# Patient Record
Sex: Female | Born: 1990 | Race: Black or African American | Hispanic: No | Marital: Single | Smoking: Current every day smoker
Health system: Southern US, Community
[De-identification: ages and names within clinical notes are randomized; demographics above are authoritative.]

## PROBLEM LIST (undated history)

## (undated) DIAGNOSIS — I10 Essential (primary) hypertension: Secondary | ICD-10-CM

## (undated) HISTORY — PX: LEG SURGERY: SHX1003

---

## 2016-08-09 ENCOUNTER — Emergency Department
Admission: EM | Admit: 2016-08-09 | Discharge: 2016-08-10 | Disposition: A | Discharge status: Home / Self Care | Payer: BLUE CROSS/BLUE SHIELD | Attending: Emergency Medicine | Admitting: Emergency Medicine

## 2016-08-09 ENCOUNTER — Emergency Department: Payer: BLUE CROSS/BLUE SHIELD

## 2016-08-09 DIAGNOSIS — I10 Essential (primary) hypertension: Secondary | ICD-10-CM | POA: Diagnosis not present

## 2016-08-09 DIAGNOSIS — F17200 Nicotine dependence, unspecified, uncomplicated: Secondary | ICD-10-CM | POA: Diagnosis not present

## 2016-08-09 DIAGNOSIS — R51 Headache: Secondary | ICD-10-CM | POA: Insufficient documentation

## 2016-08-09 DIAGNOSIS — R079 Chest pain, unspecified: Secondary | ICD-10-CM | POA: Diagnosis present

## 2016-08-09 HISTORY — DX: Essential (primary) hypertension: I10

## 2016-08-09 NOTE — ED Triage Notes (Signed)
Patient reports chest tightness.  Reports took bp at home it was elevated, patient with history of hypertension and states took extra dose of medication.  Reports now getting a headache.

## 2016-08-10 ENCOUNTER — Encounter: Payer: Self-pay | Admitting: Emergency Medicine

## 2016-08-10 LAB — BASIC METABOLIC PANEL
ANION GAP: 8 (ref 5–15)
BUN: 9 mg/dL (ref 6–20)
CHLORIDE: 99 mmol/L — AB (ref 101–111)
CO2: 29 mmol/L (ref 22–32)
Calcium: 9.3 mg/dL (ref 8.9–10.3)
Creatinine, Ser: 0.82 mg/dL (ref 0.44–1.00)
Glucose, Bld: 105 mg/dL — ABNORMAL HIGH (ref 65–99)
POTASSIUM: 3.1 mmol/L — AB (ref 3.5–5.1)
SODIUM: 136 mmol/L (ref 135–145)

## 2016-08-10 LAB — CBC
HCT: 38.1 % (ref 35.0–47.0)
HEMOGLOBIN: 12.2 g/dL (ref 12.0–16.0)
MCH: 22.1 pg — ABNORMAL LOW (ref 26.0–34.0)
MCHC: 31.9 g/dL — ABNORMAL LOW (ref 32.0–36.0)
MCV: 69.2 fL — ABNORMAL LOW (ref 80.0–100.0)
Platelets: 347 10*3/uL (ref 150–440)
RBC: 5.5 MIL/uL — AB (ref 3.80–5.20)
RDW: 19 % — ABNORMAL HIGH (ref 11.5–14.5)
WBC: 10.6 10*3/uL (ref 3.6–11.0)

## 2016-08-10 LAB — TROPONIN I: Troponin I: <0.03 ng/mL (ref <0.03)

## 2016-08-10 NOTE — ED Notes (Signed)
Pt discharged to home.  Discharge instructions reviewed.  Verbalized understanding.  No questions or concerns at this time.  Teach back verified.  Pt in NAD.  No items left in ED.   

## 2016-08-10 NOTE — ED Notes (Signed)
Pt requesting water to frink, per Dr. Zenda AlpersWebster okay to provide pt with water.

## 2016-08-10 NOTE — ED Provider Notes (Signed)
[facility] Emergency Department Provider Note   ____________________________________________   First MD Initiated Contact with Patient 08/10/16 0021     (approximate)  I have reviewed the triage vital signs and the nursing notes.   HISTORY  Chief Complaint Chest Pain and Hypertension    HPI Loretta Burke is a 26 y.o. female who comes into the hospital today with some chest pain and headache. The patient reports her blood pressure was also elevated at home. When she checked it was 180/120. The patient takes labetalol and took one at 10 PM. She reports that her blood pressure didn't come down so she took another one at 80 and decided to come in. The chest pain started EMS well and was in the left side of her chest. She did not take anything for the pain but reports that her chest pain is now down to 1 out of 10 in intensity. She reports her headache is also gone. The patient has no nausea or vomiting but did have a little bit of dizziness. She had no sweats and no shortness of breath. She is here today for evaluation of her chest pain and high blood pressure.   Past Medical History:  Diagnosis Date  . Hypertension     There are no active problems to display for this patient.   Past Surgical History:  Procedure Laterality Date  . LEG SURGERY      Prior to Admission medications   Not on File    Allergies Oxycodone  No family history on file.  Social History Social History  Substance Use Topics  . Smoking status: Current Every Day Smoker  . Smokeless tobacco: Not on file  . Alcohol use Yes    Review of Systems Constitutional: No fever/chills Eyes: No visual changes. ENT: No sore throat. Cardiovascular:  chest pain. Respiratory: Denies shortness of breath. Gastrointestinal: No abdominal pain.  No nausea, no vomiting.  No diarrhea.  No constipation. Genitourinary: Negative for dysuria. Musculoskeletal: Negative for back pain. Skin:  Negative for rash. Neurological: headache  10-point ROS otherwise negative.  ____________________________________________   PHYSICAL EXAM:  VITAL SIGNS: ED Triage Vitals  Enc Vitals Group     BP 08/09/16 2320 (!) 151/93     Pulse Rate 08/09/16 2320 73     Resp 08/09/16 2320 18     Temp 08/09/16 2320 98.4 F (36.9 C)     Temp Source 08/09/16 2320 Oral     SpO2 08/09/16 2320 99 %     Weight 08/09/16 2318 268 lb (121.6 kg)     Height 08/09/16 2318 5\' 4"  (1.626 m)     Head Circumference --      Peak Flow --      Pain Score 08/09/16 2318 6     Pain Loc --      Pain Edu? --      Excl. in GC? --     Constitutional: Alert and oriented. Well appearing and in no acute distress. Eyes: Conjunctivae are normal. PERRL. EOMI. Head: Atraumatic. Nose: No congestion/rhinnorhea. Mouth/Throat: Mucous membranes are moist.  Oropharynx non-erythematous. Cardiovascular: Normal rate, regular rhythm. Grossly normal heart sounds.  Good peripheral circulation. Respiratory: Normal respiratory effort.  No retractions. Lungs CTAB. Gastrointestinal: Soft and nontender. No distention. Positive bowel sounds Musculoskeletal: No lower extremity tenderness nor edema.   Neurologic:  Normal speech and language.  Skin:  Skin is warm, dry and intact.  Psychiatric: Mood and affect are normal.  ____________________________________________  LABS (all labs ordered are listed, but only abnormal results are displayed)  Labs Reviewed  BASIC METABOLIC PANEL - Abnormal; Notable for the following:       Result Value   Potassium 3.1 (*)    Chloride 99 (*)    Glucose, Bld 105 (*)    All other components within normal limits  CBC - Abnormal; Notable for the following:    RBC 5.50 (*)    MCV 69.2 (*)    MCH 22.1 (*)    MCHC 31.9 (*)    RDW 19.0 (*)    All other components within normal limits  TROPONIN I  TROPONIN I   ____________________________________________  EKG  ED ECG REPORT I, Rebecka ApleyWebster,   Allison P, the attending physician, personally viewed and interpreted this ECG.   Date: 08/09/2016  EKG Time: 2322  Rate: 76  Rhythm: normal sinus rhythm  Axis: normal  Intervals:none  ST&T Change: none  ____________________________________________  RADIOLOGY  CXR ____________________________________________   PROCEDURES  Procedure(s) performed: None  Procedures  Critical Care performed: No  ____________________________________________   INITIAL IMPRESSION / ASSESSMENT AND PLAN / ED COURSE  Pertinent labs & imaging results that were available during my care of the patient were reviewed by me and considered in my medical decision making (see chart for details).  This is 26 year old female who comes into the hospital today with some chest pain, headache and some elevated blood pressure. The patient did take some medication prior to coming in and her blood pressure has improved. The patient's pain is also improved. The patient's initial blood work is unremarkable but as this pain started right before she came and I will repeat a 3 hour troponin to insure that she is not having any developing ischemia. The patient's pain is controlled at this time. She is resting comfortably and I'll reassess the patient once her repeat her troponin.  Clinical Course as of Aug 10 241  Mon Aug 10, 2016  0025 No active cardiopulmonary disease. DG Chest 2 View [AW]    Clinical Course User Index [AW] Rebecka ApleyAllison P Webster, MD   The patient's repeat troponin is negative. She'll be discharged home to follow-up with her primary care physician or the acute care clinic. The patient's chest pain and headache are improved.  ____________________________________________   FINAL CLINICAL IMPRESSION(S) / ED DIAGNOSES  Final diagnoses:  Chest pain, unspecified type  Hypertension, unspecified type      NEW MEDICATIONS STARTED DURING THIS VISIT:  New Prescriptions   No medications on file      Note:  This document was prepared using Dragon voice recognition software and may include unintentional dictation errors.    Rebecka ApleyAllison P Webster, MD 08/10/16 [phone]

## 2018-07-24 IMAGING — CR DG CHEST 2V
2 series · 2 of 2 positions shown · non-contrast
Comparison: None.

CLINICAL DATA: Chest tightness

EXAM:
CHEST  2 VIEW

[chest pa]
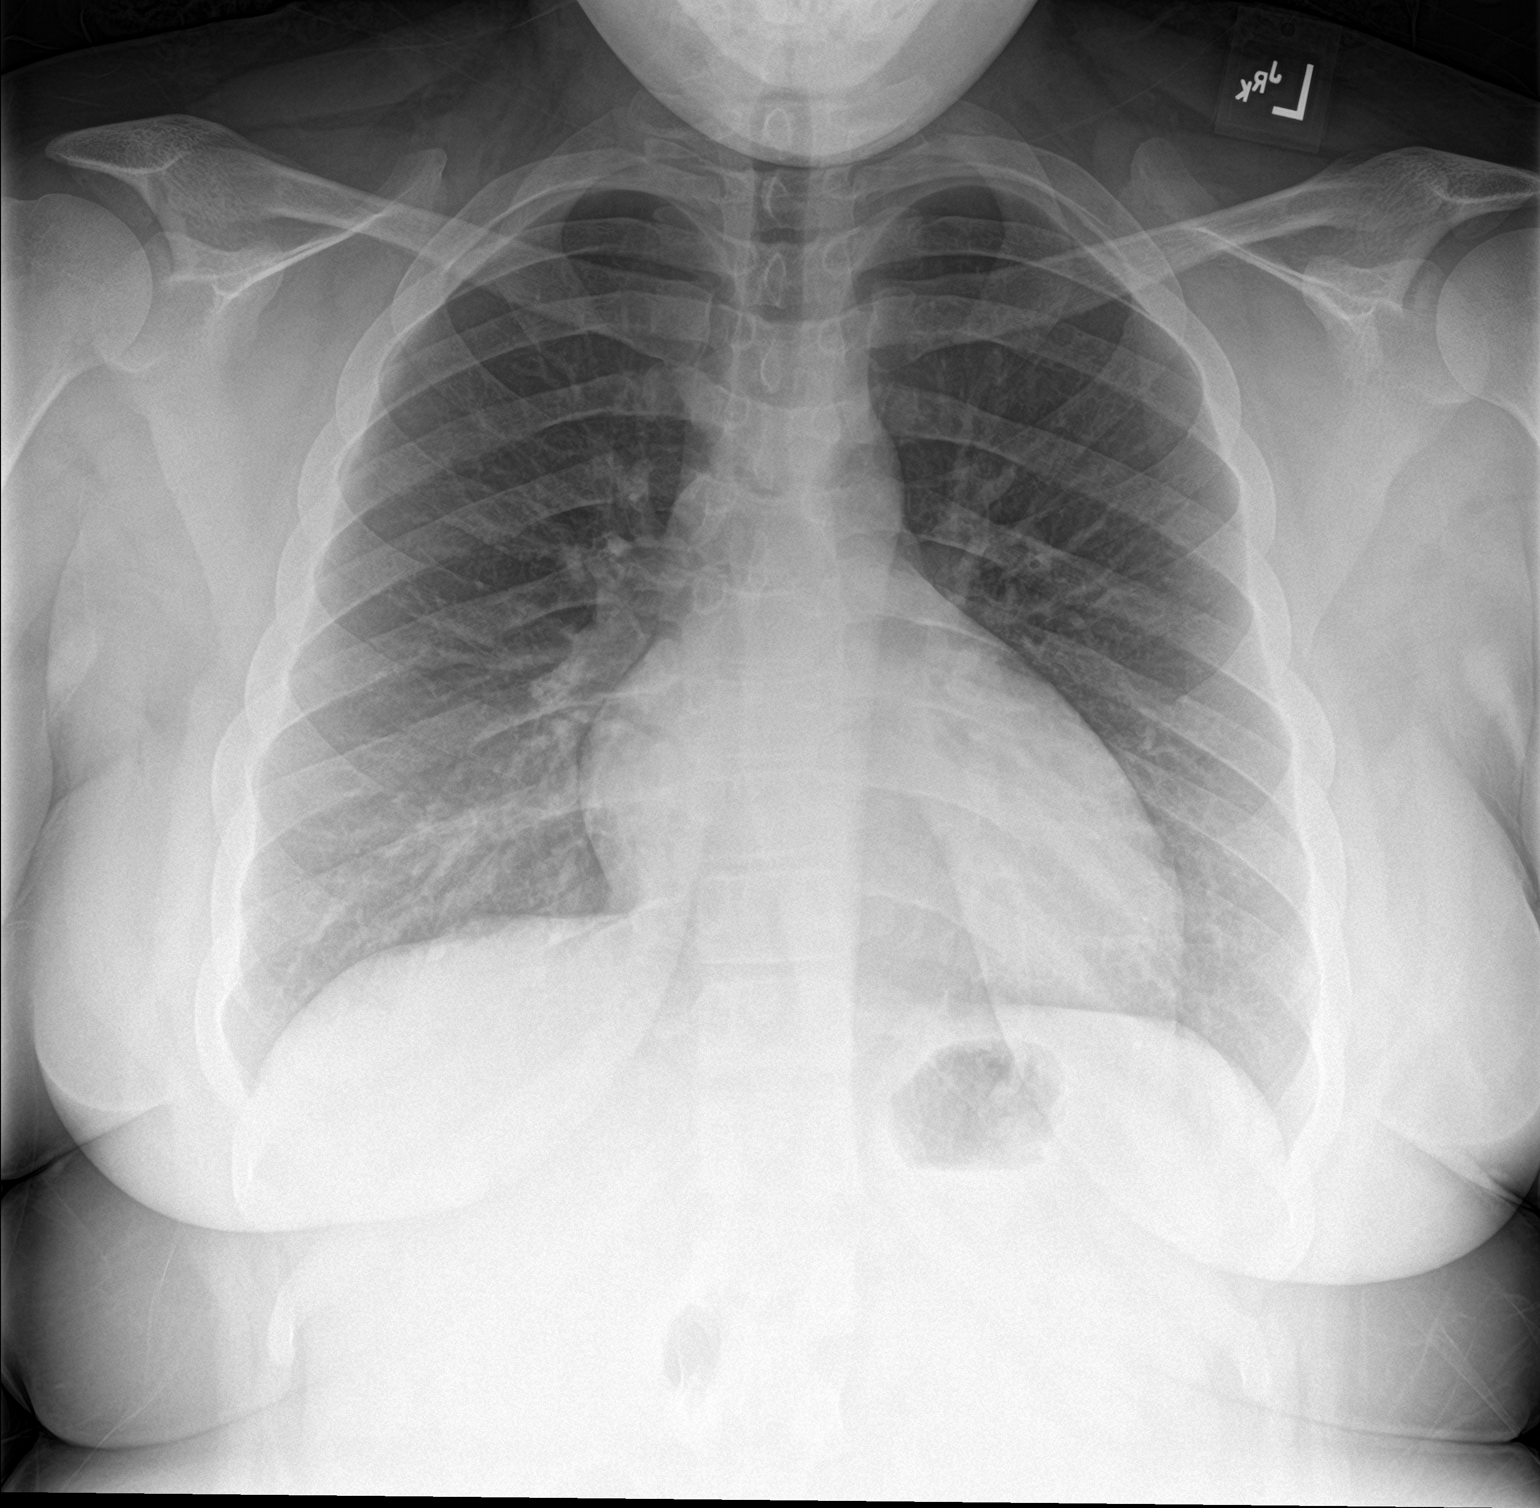

[chest lat]
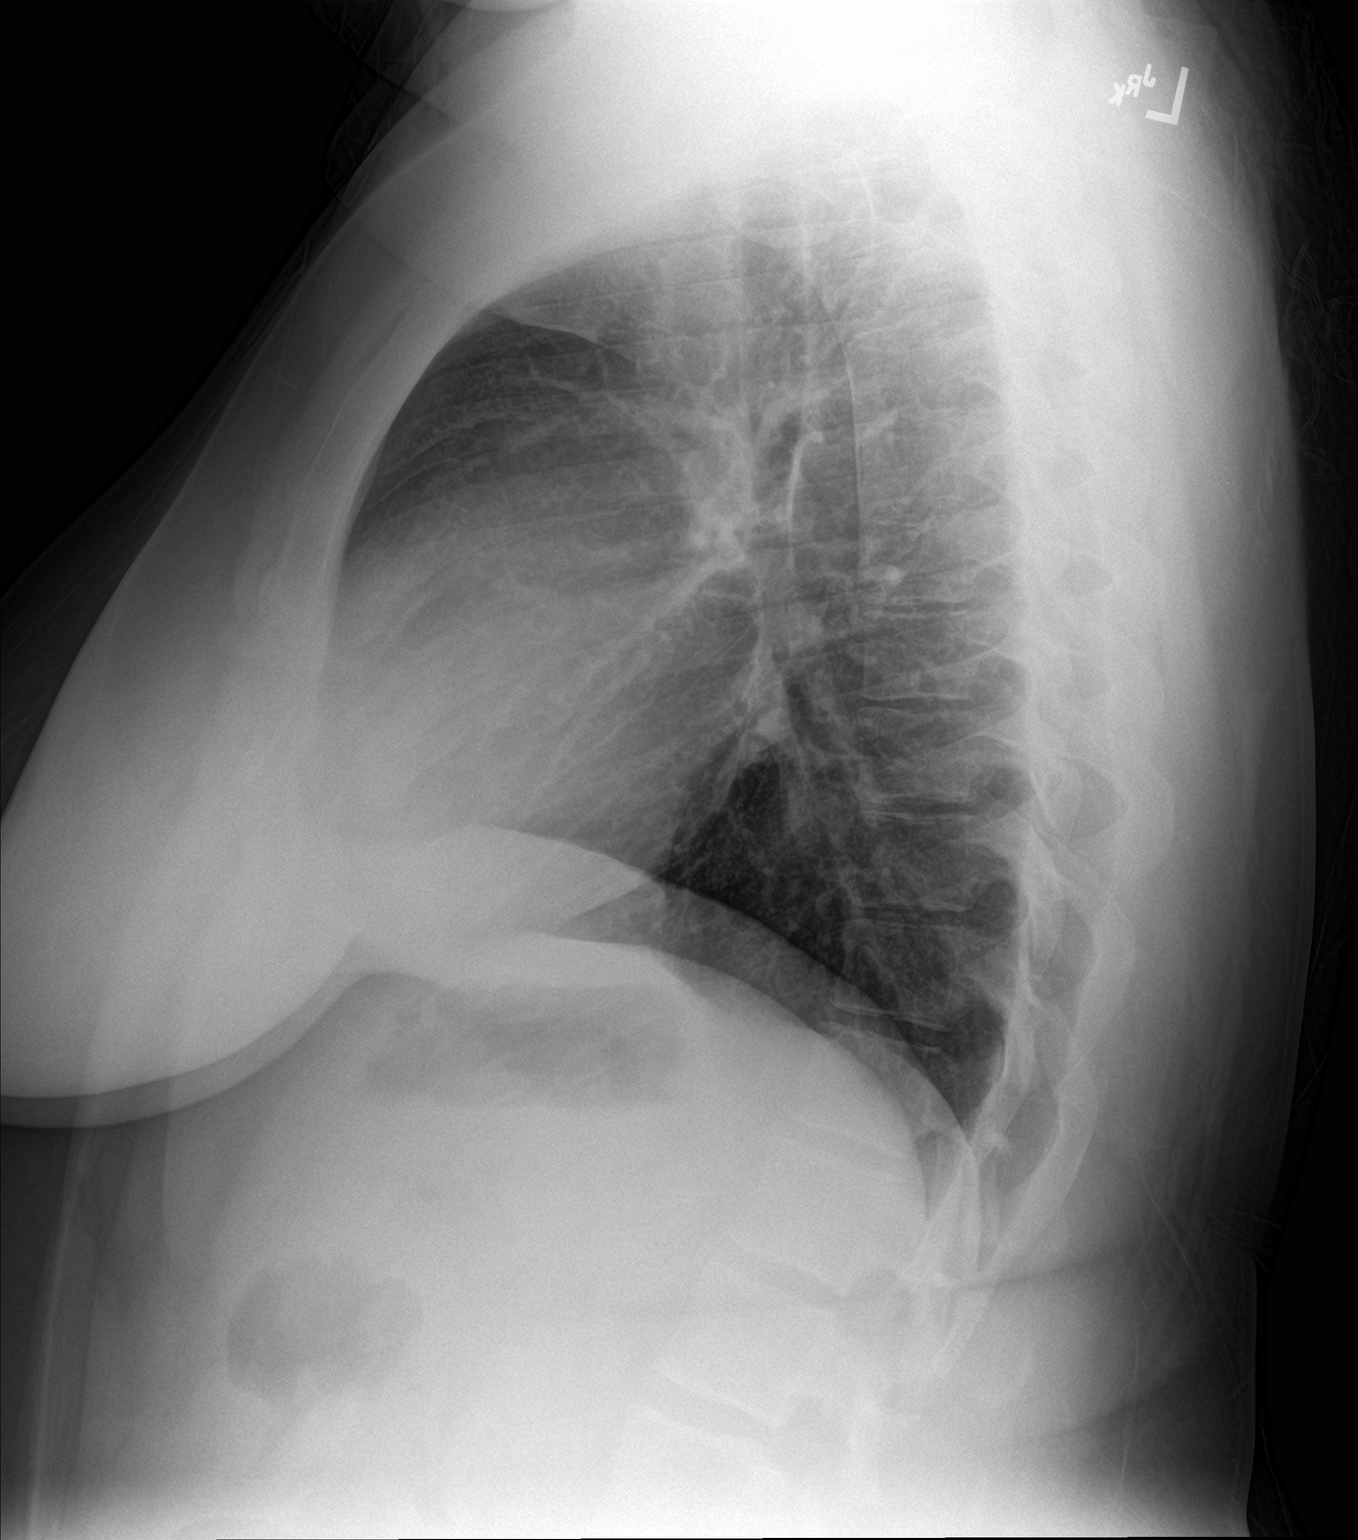

[2 of 2 positions shown; findings below may reference images not displayed]

FINDINGS: The heart size and mediastinal contours are within normal limits.
Both lungs are clear. The visualized skeletal structures are
unremarkable.
IMPRESSION: No active cardiopulmonary disease.

## 2019-10-25 ENCOUNTER — Encounter: Admit: 2019-10-25 | Payer: PRIVATE HEALTH INSURANCE

## 2019-10-25 ENCOUNTER — Emergency Department: Admit: 2019-10-25 | Payer: PRIVATE HEALTH INSURANCE

## 2019-10-25 ENCOUNTER — Inpatient Hospital Stay: Admit: 2019-10-25 | Discharge: 2019-10-25 | Payer: MEDICAID

## 2019-10-25 DIAGNOSIS — R079 Chest pain, unspecified: Secondary | ICD-10-CM

## 2019-10-25 DIAGNOSIS — R0602 Shortness of breath: Secondary | ICD-10-CM

## 2019-10-25 DIAGNOSIS — I10 Essential (primary) hypertension: Secondary | ICD-10-CM

## 2019-10-25 LAB — CBC WITH AUTO DIFFERENTIAL
BKR WAM ABSOLUTE IMMATURE GRANULOCYTES: 0.1 x 1000/ÂµL (ref 0.0–0.4)
BKR WAM ABSOLUTE LYMPHOCYTE COUNT: 3 x 1000/ÂµL (ref 0.5–5.4)
BKR WAM ABSOLUTE NRBC: 0 x 1000/ÂµL
BKR WAM ANALYZER ANC: 4.8 x 1000/ÂµL (ref 2.2–7.2)
BKR WAM BASOPHIL ABSOLUTE COUNT: 0.1 x 1000/ÂµL (ref 0.0–0.2)
BKR WAM BASOPHILS: 0.8 % (ref 0.0–2.0)
BKR WAM EOSINOPHIL ABSOLUTE COUNT: 0 x 1000/ÂµL (ref 0.0–0.4)
BKR WAM EOSINOPHILS: 0.4 % (ref 0.0–4.0)
BKR WAM HEMATOCRIT: 38.8 % (ref 36.0–48.0)
BKR WAM HEMOGLOBIN: 12.8 g/dL (ref 12.0–15.0)
BKR WAM IMMATURE GRANULOCYTES: 0.6 % — ABNORMAL HIGH (ref 0.0–0.4)
BKR WAM LYMPHOCYTES: 35.3 % (ref 10.0–50.0)
BKR WAM MCH (PG): 24.8 pg — ABNORMAL LOW (ref 25.0–35.0)
BKR WAM MCHC: 33 g/dL (ref 33.0–37.0)
BKR WAM MCV: 75 fL — ABNORMAL LOW (ref 81.0–99.0)
BKR WAM MONOCYTE ABSOLUTE COUNT: 0.5 x 1000/ÂµL (ref 0.1–1.2)
BKR WAM MONOCYTES: 6.4 % (ref 3.0–11.0)
BKR WAM MPV: 12.2 fL — ABNORMAL HIGH (ref 8.0–12.0)
BKR WAM NEUTROPHILS: 56.5 % (ref 45.0–90.0)
BKR WAM NUCLEATED RED BLOOD CELLS: 0 % (ref 0.0–0.0)
BKR WAM PLATELETS: 344 x1000/ÂµL (ref 120–450)
BKR WAM RDW-CV: 16.3 % — ABNORMAL HIGH (ref 11.5–14.5)
BKR WAM RED BLOOD CELL COUNT: 5.2 M/ÂµL (ref 3.5–5.5)
BKR WAM WHITE BLOOD CELL COUNT: 8.5 x1000/ÂµL (ref 4.8–10.8)

## 2019-10-25 LAB — COMPREHENSIVE METABOLIC PANEL
BKR A/G RATIO: 1.4 (ref 1.0–2.2)
BKR ALANINE AMINOTRANSFERASE (ALT): 73 U/L — ABNORMAL HIGH (ref 10–35)
BKR ALBUMIN: 4.9 g/dL (ref 3.6–4.9)
BKR ALKALINE PHOSPHATASE: 72 U/L (ref 9–122)
BKR ANION GAP: 12 (ref 7–17)
BKR ASPARTATE AMINOTRANSFERASE (AST): 235 U/L — ABNORMAL HIGH (ref 10–35)
BKR BILIRUBIN TOTAL: 0.3 mg/dL (ref <=1.2)
BKR BLOOD UREA NITROGEN: 14 mg/dL (ref 6–20)
BKR BUN / CREAT RATIO: 13.6 (ref 8.0–23.0)
BKR CALCIUM: 9.5 mg/dL (ref 8.8–10.2)
BKR CHLORIDE: 97 mmol/L — ABNORMAL LOW (ref 98–107)
BKR CO2: 28 mmol/L (ref 20–30)
BKR CREATININE: 1.03 mg/dL (ref 0.40–1.30)
BKR EGFR (AFR AMER): >60 mL/min/{1.73_m2} (ref >60)
BKR EGFR (NON AFRICAN AMERICAN): >60 mL/min/{1.73_m2} (ref >60)
BKR GLOBULIN: 3.6 g/dL
BKR GLUCOSE: 128 mg/dL — ABNORMAL HIGH (ref 70–100)
BKR POTASSIUM: 3.6 mmol/L (ref 3.3–5.1)
BKR PROTEIN TOTAL: 8.5 g/dL (ref 6.6–8.7)
BKR SODIUM: 137 mmol/L (ref 136–144)

## 2019-10-25 LAB — MAGNESIUM: BKR MAGNESIUM: 2.4 mg/dL (ref 1.7–2.4)

## 2019-10-25 LAB — ZZZTROPONIN T     (Q): BKR TROPONIN T: <0.01 ng/mL (ref <0.01)

## 2019-10-25 MED ORDER — HYDROCHLOROTHIAZIDE 25 MG TABLET
25 mg | Status: AC
Start: 2019-10-25 — End: ?

## 2019-10-25 NOTE — ED Triage Note
I evaluated this patient as the PA in triage and completed a medical screening examination.  Patient will need further care.  Initial care orders will be placed as appropriate.Jennye Moccasin, PA5:23 PMCHEST PAIN WITH INSPIRATION,SOB WITH EXERTION2 HOURS,

## 2019-10-26 DIAGNOSIS — I10 Essential (primary) hypertension: Secondary | ICD-10-CM

## 2019-10-26 DIAGNOSIS — Z9884 Bariatric surgery status: Secondary | ICD-10-CM

## 2019-10-26 DIAGNOSIS — Z79899 Other long term (current) drug therapy: Secondary | ICD-10-CM

## 2019-10-26 DIAGNOSIS — Z885 Allergy status to narcotic agent status: Secondary | ICD-10-CM

## 2019-10-26 LAB — EXTRA BLUE TOP SPECIMEN     (BH GH LMW)

## 2019-10-26 NOTE — ED Provider Notes
HistoryChief Complaint Patient presents with ? Chest Pain   cp   with   inspiration x 2 hours  , denies  n/v/d ? Shortness of Breath  29 year old female history of HTN, presents to the ED with nonexertional pleuritic chest pain since 1:30 PM. States had chest pain with inspiration only. Associated with SOB on exertion. States it started while she was sitting at work. States she has not had SOB while in the ED and chest pain improved significantly about an hour ago. States she currently is asymptomatic. Denies fevers, chills, nausea, vomiting, cough, hemoptysis, abdominal pain, neck pain, headache, vision changes, leg swelling, calf pain. Denies history of blood clots or malignancy, recent surgery, hormone/estrogen OCP use, prolonged immobilization, hemoptysis. Denies family history of cardiac disease. The history is provided by the patient. Chest PainPain location:  R chestPain quality: tightness  Pain radiates to:  Does not radiatePain severity:  MildOnset quality:  GradualProgression:  ResolvedContext: at rest  Relieved by:  None triedWorsened by:  NothingIneffective treatments:  None triedAssociated symptoms: shortness of breath  Associated symptoms: no abdominal pain, no altered mental status, no anorexia, no anxiety, no back pain, no claudication, no cough, no diaphoresis, no dizziness, no dysphagia, no fatigue, no fever, no headache, no heartburn, no lower extremity edema, no nausea, no near-syncope, no numbness, no orthopnea, no palpitations, no PND, no syncope, no vomiting and no weakness  Risk factors: hypertension  Risk factors: no coronary artery disease, no diabetes mellitus, no immobilization, not obese, no prior DVT/PE, no smoking and no surgery   Past Medical History: Diagnosis Date ? Hypertension  Past Surgical History: Procedure Laterality Date ? ABDOMINAL SURGERY    Gastric sleeve 2018 No family history on file.Social History Socioeconomic History ? Marital status: Single   Spouse name: Not on file ? Number of children: Not on file ? Years of education: Not on file ? Highest education level: Not on file ED Other Social History E-cigarette/Vaping Substances E-cigarette/Vaping Devices Review of Systems Constitutional: Negative for chills, diaphoresis, fatigue and fever. HENT: Negative for trouble swallowing.  Respiratory: Positive for shortness of breath. Negative for cough.  Cardiovascular: Positive for chest pain. Negative for palpitations, orthopnea, claudication, leg swelling, syncope, PND and near-syncope. Gastrointestinal: Negative for abdominal pain, anorexia, heartburn, nausea and vomiting. Musculoskeletal: Negative for back pain. Skin: Negative for rash. Neurological: Negative for dizziness, syncope, weakness, numbness and headaches. All other systems reviewed and are negative. Physical ExamED Triage Vitals [10/25/19 1635]BP: 124/87Pulse: 70Pulse from  O2 sat: n/aResp: 20Temp: 98.6 ?F (37 ?C)Temp src: OralSpO2: 100 % BP 127/88  - Pulse 61  - Temp 98.2 ?F (36.8 ?C) (Oral)  - Resp 18  - LMP 10/05/2019  - SpO2 98% Physical ExamVitals signs and nursing note reviewed. Constitutional:     General: She is not in acute distress.   Appearance: She is well-developed. She is not ill-appearing, toxic-appearing or diaphoretic. HENT:    Head: Normocephalic and atraumatic. Neck:    Musculoskeletal: Normal range of motion and neck supple. Cardiovascular:    Rate and Rhythm: Normal rate and regular rhythm.    Heart sounds: Murmur present.    Comments: 3/6 systolic murmur (patient reports has history of murmur) Pulmonary:    Effort: Pulmonary effort is normal. No tachypnea, accessory muscle usage or respiratory distress.    Breath sounds: Normal breath sounds. No stridor. No decreased breath sounds, wheezing, rhonchi or rales. Chest:    Chest wall: No mass, tenderness, crepitus or edema. Abdominal:  General: Bowel sounds are normal.    Palpations: Abdomen is soft. There is no mass.    Tenderness: There is no abdominal tenderness. There is no guarding or rebound. Musculoskeletal: Normal range of motion.    Right lower leg: She exhibits no tenderness. No edema.    Left lower leg: She exhibits no tenderness. No edema. Skin:   General: Skin is warm and dry. Neurological:    General: No focal deficit present.    Mental Status: She is alert. Psychiatric:       Mood and Affect: Mood normal.  ProceduresProcedures ED COURSEDiscuss the patient with other providers: yes (Dr. Shanda Bumps Reevaluation: 29 year old female, history of HTN, presents to the ED with nonexertional, pleuritic chest pain since 1:30 PM, associated with exertional SOB. Symptoms resolved 1 hour ago. Vitals in triage are stable, she is sating at 100% on RA and normal heart rate. On exam, patient is well appearing, heart RRR, lungs CTA, abdomen soft, nontender, no peripheral edema. DDx includes costochondritis, URI, PNA, COVID, highly doubt pericarditis, myocarditis, ACS. I did consider, however patient has no risks and symptoms are resolved so I highly doubt this and PERC is 0. PIT ordered CBC, BMP, troponin, EKG, CXR. EKG shows no signs of acute ischemia or arrhythmia. CXR is negative. Labs are unremarkable. I did discussed with patient option of obtaining D-dimer, discussed risks vs benefits and that I think PE is highly unlikely. Through shared-decision making, patient opted to hold off on D-dimer for now, states she will plan to follow up with PMD tomorrow and return to ED if symptoms worsen. I think this is reasonable. Discussed plan with Dr. Holland Falling. Given strict return precautions and advised follow up with PMD tomorrow. Patient is agreeable to plan and stable at time of discharge. Evaluation, management, and disposition decisions made in context of COVID pandemic. In this patient, the increased risk of nosocomial infection is of particular concern. In my judgement, the balance of clinical factors dictate expedited evaluation and discharge from the ED in the interest of this patient's health and safetyPatient progress: resolvedClinical Impressions as of Oct 26 1639 Chest pain, unspecified type  ED DispositionDischarge Neita Goodnight, PA04/21/21 2150I reviewed the history and physical examination and discussed the management with  TSE, PA.  I agree with the assessment and plan of care, with the following exceptions: Jolyn Nap, MD Ronette Deter, MD04/22/21 [phone]

## 2019-10-26 NOTE — ED Notes
 8:25 PM 29 y.o female presents to ED c.o chest pain with inspiration that began at 4 PM. Pt reports improvement in pain since arrival, states she does not feel the pain anymore. Denies cough. Reports shortness of breath with exertion. Breathing even and unlabored, skin tone appropriate. Pending provider eval. Daryll Drown, RN

## 2019-10-26 NOTE — Discharge Instructions
Please call to schedule follow up with your primary doctor as soon as possible. If you develop worsening symptoms or any other concerns, please return to the emergency department.

## 2019-10-26 NOTE — ED Notes
Abelardo Diesel, RN9:50 PMPt cleared for d/c by PA Vincent Gros. Paperwork given and explained, pt verbalized understanding. Ambulatory and in stable condition upon d/c.
